# Patient Record
Sex: Male | Born: 1960 | Race: White | Hispanic: No
Health system: Southern US, Community
[De-identification: ages and names within clinical notes are randomized; demographics above are authoritative.]

## PROBLEM LIST (undated history)

## (undated) DIAGNOSIS — F1010 Alcohol abuse, uncomplicated: Secondary | ICD-10-CM

---

## 2016-08-03 ENCOUNTER — Emergency Department (HOSPITAL_COMMUNITY): Payer: No Typology Code available for payment source

## 2016-08-03 ENCOUNTER — Inpatient Hospital Stay (HOSPITAL_COMMUNITY)
Admission: EM | Admit: 2016-08-03 | Discharge: 2016-09-06 | DRG: 296 | Disposition: E | Payer: No Typology Code available for payment source | Attending: Surgery | Admitting: Surgery

## 2016-08-03 ENCOUNTER — Encounter (HOSPITAL_COMMUNITY): Payer: Self-pay

## 2016-08-03 DIAGNOSIS — G931 Anoxic brain damage, not elsewhere classified: Secondary | ICD-10-CM | POA: Diagnosis present

## 2016-08-03 DIAGNOSIS — Y908 Blood alcohol level of 240 mg/100 ml or more: Secondary | ICD-10-CM | POA: Diagnosis present

## 2016-08-03 DIAGNOSIS — R000 Tachycardia, unspecified: Secondary | ICD-10-CM | POA: Diagnosis not present

## 2016-08-03 DIAGNOSIS — Z515 Encounter for palliative care: Secondary | ICD-10-CM | POA: Diagnosis present

## 2016-08-03 DIAGNOSIS — S0003XA Contusion of scalp, initial encounter: Secondary | ICD-10-CM | POA: Diagnosis present

## 2016-08-03 DIAGNOSIS — S2249XA Multiple fractures of ribs, unspecified side, initial encounter for closed fracture: Secondary | ICD-10-CM | POA: Diagnosis present

## 2016-08-03 DIAGNOSIS — E876 Hypokalemia: Secondary | ICD-10-CM | POA: Diagnosis present

## 2016-08-03 DIAGNOSIS — Y92488 Other paved roadways as the place of occurrence of the external cause: Secondary | ICD-10-CM | POA: Diagnosis not present

## 2016-08-03 DIAGNOSIS — F10129 Alcohol abuse with intoxication, unspecified: Secondary | ICD-10-CM | POA: Diagnosis present

## 2016-08-03 DIAGNOSIS — I469 Cardiac arrest, cause unspecified: Principal | ICD-10-CM | POA: Diagnosis present

## 2016-08-03 DIAGNOSIS — R402112 Coma scale, eyes open, never, at arrival to emergency department: Secondary | ICD-10-CM | POA: Diagnosis present

## 2016-08-03 DIAGNOSIS — R402312 Coma scale, best motor response, none, at arrival to emergency department: Secondary | ICD-10-CM | POA: Diagnosis present

## 2016-08-03 DIAGNOSIS — R0902 Hypoxemia: Secondary | ICD-10-CM | POA: Diagnosis present

## 2016-08-03 DIAGNOSIS — Z66 Do not resuscitate: Secondary | ICD-10-CM | POA: Diagnosis present

## 2016-08-03 DIAGNOSIS — I468 Cardiac arrest due to other underlying condition: Secondary | ICD-10-CM | POA: Diagnosis present

## 2016-08-03 DIAGNOSIS — Z978 Presence of other specified devices: Secondary | ICD-10-CM

## 2016-08-03 DIAGNOSIS — R402212 Coma scale, best verbal response, none, at arrival to emergency department: Secondary | ICD-10-CM | POA: Diagnosis present

## 2016-08-03 DIAGNOSIS — S0990XA Unspecified injury of head, initial encounter: Secondary | ICD-10-CM | POA: Diagnosis present

## 2016-08-03 DIAGNOSIS — Z7189 Other specified counseling: Secondary | ICD-10-CM

## 2016-08-03 DIAGNOSIS — D62 Acute posthemorrhagic anemia: Secondary | ICD-10-CM | POA: Diagnosis present

## 2016-08-03 HISTORY — DX: Alcohol abuse, uncomplicated: F10.10

## 2016-08-03 LAB — I-STAT ARTERIAL BLOOD GAS, ED
ACID-BASE DEFICIT: 12 mmol/L — AB (ref 0.0–2.0)
Bicarbonate: 13 mmol/L — ABNORMAL LOW (ref 20.0–28.0)
O2 SAT: 100 %
PCO2 ART: 25.3 mmHg — AB (ref 32.0–48.0)
PH ART: 7.312 — AB (ref 7.350–7.450)
TCO2: 14 mmol/L (ref 0–100)
pO2, Arterial: 569 mmHg — ABNORMAL HIGH (ref 83.0–108.0)

## 2016-08-03 LAB — CBC WITH DIFFERENTIAL/PLATELET
Basophils Absolute: 0.1 10*3/uL (ref 0.0–0.1)
Basophils Relative: 1 %
EOS ABS: 0 10*3/uL (ref 0.0–0.7)
EOS PCT: 1 %
HCT: 35.3 % — ABNORMAL LOW (ref 39.0–52.0)
HEMOGLOBIN: 11.7 g/dL — AB (ref 13.0–17.0)
LYMPHS ABS: 3 10*3/uL (ref 0.7–4.0)
Lymphocytes Relative: 61 %
MCH: 28.5 pg (ref 26.0–34.0)
MCHC: 33.1 g/dL (ref 30.0–36.0)
MCV: 85.9 fL (ref 78.0–100.0)
MONOS PCT: 4 %
Monocytes Absolute: 0.2 10*3/uL (ref 0.1–1.0)
Neutro Abs: 1.6 10*3/uL — ABNORMAL LOW (ref 1.7–7.7)
Neutrophils Relative %: 33 %
PLATELETS: 101 10*3/uL — AB (ref 150–400)
RBC: 4.11 MIL/uL — ABNORMAL LOW (ref 4.22–5.81)
RDW: 14.4 % (ref 11.5–15.5)
WBC: 4.9 10*3/uL (ref 4.0–10.5)

## 2016-08-03 LAB — PROTIME-INR
INR: 1.28
PROTHROMBIN TIME: 16.1 s — AB (ref 11.4–15.2)

## 2016-08-03 LAB — I-STAT CHEM 8, ED
BUN: <3 mg/dL — ABNORMAL LOW (ref 6–20)
CREATININE: 1.1 mg/dL (ref 0.61–1.24)
Calcium, Ion: 0.81 mmol/L — CL (ref 1.15–1.40)
Chloride: 99 mmol/L — ABNORMAL LOW (ref 101–111)
GLUCOSE: 118 mg/dL — AB (ref 65–99)
HCT: 34 % — ABNORMAL LOW (ref 39.0–52.0)
HEMOGLOBIN: 11.6 g/dL — AB (ref 13.0–17.0)
Potassium: 3.6 mmol/L (ref 3.5–5.1)
Sodium: 132 mmol/L — ABNORMAL LOW (ref 135–145)
TCO2: 13 mmol/L (ref 0–100)

## 2016-08-03 LAB — COMPREHENSIVE METABOLIC PANEL
ALT: 798 U/L — AB (ref 17–63)
AST: 1641 U/L — AB (ref 15–41)
Albumin: 3.3 g/dL — ABNORMAL LOW (ref 3.5–5.0)
Alkaline Phosphatase: 137 U/L — ABNORMAL HIGH (ref 38–126)
Anion gap: 20 — ABNORMAL HIGH (ref 5–15)
BUN: 5 mg/dL — AB (ref 6–20)
CHLORIDE: 94 mmol/L — AB (ref 101–111)
CO2: 16 mmol/L — AB (ref 22–32)
CREATININE: 1.05 mg/dL (ref 0.61–1.24)
Calcium: 7.9 mg/dL — ABNORMAL LOW (ref 8.9–10.3)
Glucose, Bld: 200 mg/dL — ABNORMAL HIGH (ref 65–99)
POTASSIUM: 3.8 mmol/L (ref 3.5–5.1)
SODIUM: 130 mmol/L — AB (ref 135–145)
Total Bilirubin: 1 mg/dL (ref 0.3–1.2)
Total Protein: 6.8 g/dL (ref 6.5–8.1)

## 2016-08-03 LAB — URINALYSIS, ROUTINE W REFLEX MICROSCOPIC
Bilirubin Urine: NEGATIVE
Glucose, UA: 50 mg/dL — AB
Ketones, ur: NEGATIVE mg/dL
Leukocytes, UA: NEGATIVE
Nitrite: NEGATIVE
Protein, ur: 100 mg/dL — AB
SPECIFIC GRAVITY, URINE: 1.01 (ref 1.005–1.030)
pH: 6 (ref 5.0–8.0)

## 2016-08-03 LAB — I-STAT CG4 LACTIC ACID, ED: LACTIC ACID, VENOUS: 9.78 mmol/L — AB (ref 0.5–1.9)

## 2016-08-03 LAB — ETHANOL: Alcohol, Ethyl (B): 380 mg/dL (ref <5)

## 2016-08-03 MED ORDER — ENOXAPARIN SODIUM 30 MG/0.3ML ~~LOC~~ SOLN
30.0000 mg | Freq: Two times a day (BID) | SUBCUTANEOUS | Status: DC
  Administered 2016-08-04 – 2016-08-06 (×6): 30 mg via SUBCUTANEOUS
  Filled 2016-08-03 (×7): qty 0.3

## 2016-08-03 MED ORDER — SODIUM CHLORIDE 0.9 % IV SOLN
INTRAVENOUS | Status: DC
  Administered 2016-08-04 – 2016-08-07 (×8): via INTRAVENOUS

## 2016-08-03 MED ORDER — ORAL CARE MOUTH RINSE: 15.0000 mL | OROMUCOSAL | Status: DC

## 2016-08-03 MED ORDER — PANTOPRAZOLE SODIUM 40 MG IV SOLR
40.0000 mg | Freq: Every day | INTRAVENOUS | Status: DC
  Administered 2016-08-04 – 2016-08-07 (×4): 40 mg via INTRAVENOUS
  Filled 2016-08-03 (×3): qty 40

## 2016-08-03 MED ORDER — CHLORHEXIDINE GLUCONATE 0.12% ORAL RINSE (MEDLINE KIT)
15.0000 mL | Freq: Two times a day (BID) | OROMUCOSAL | Status: DC
  Administered 2016-08-04 – 2016-08-07 (×8): 15 mL via OROMUCOSAL

## 2016-08-03 MED ORDER — PANTOPRAZOLE SODIUM 40 MG PO TBEC: 40.0000 mg | DELAYED_RELEASE_TABLET | Freq: Every day | ORAL | Status: DC

## 2016-08-03 MED ORDER — IOPAMIDOL (ISOVUE-300) INJECTION 61%
INTRAVENOUS | Status: AC
  Filled 2016-08-03: qty 100

## 2016-08-03 NOTE — ED Notes (Signed)
Dr. Corliss Skainssuei informed of pts ethanol 380

## 2016-08-03 NOTE — ED Notes (Signed)
Patient reconnected to monitor.

## 2016-08-03 NOTE — ED Notes (Signed)
Pt has small abrasions noted to bridge of nose, left eyebrow with hematoma, left portion of upper lip and small abrasion on forehead and one abrasion to left knee. No active bleeding.
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
## 2016-08-03 NOTE — ED Notes (Signed)
X-ray at bedside

## 2016-08-03 NOTE — ED Notes (Signed)
Report has been called to 4N. This RN awaiting for respiratory tech to transport pt upstairs
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
## 2016-08-03 NOTE — Progress Notes (Signed)
Orthopedic Tech Progress Note Patient Details:  Neldon LabellaJesse Hines 08/30/1960 [id number] Level 1 trauma ortho visit. Patient ID: Neldon LabellaJesse Musick, male   DOB: 08/02/1960, 56 y.o.   MRN: [id number]   Jennye MoccasinHughes, Reason Helzer Craig Dec 14, 2016, 9:52 PM
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
## 2016-08-03 NOTE — ED Triage Notes (Signed)
[facility]
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
## 2016-08-03 NOTE — ED Notes (Signed)
Pt back from ct
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
## 2016-08-04 ENCOUNTER — Inpatient Hospital Stay (HOSPITAL_COMMUNITY): Payer: No Typology Code available for payment source

## 2016-08-04 DIAGNOSIS — I469 Cardiac arrest, cause unspecified: Principal | ICD-10-CM

## 2016-08-04 LAB — TYPE AND SCREEN
ABO/RH(D): O NEG
Antibody Screen: NEGATIVE
UNIT DIVISION: 0
UNIT DIVISION: 0

## 2016-08-04 LAB — BPAM FFP
BLOOD PRODUCT EXPIRATION DATE: 201808052359
Blood Product Expiration Date: 201808052359
ISSUE DATE / TIME: 201807292119
ISSUE DATE / TIME: 201807292119
UNIT TYPE AND RH: 600
UNIT TYPE AND RH: 6200

## 2016-08-04 LAB — CBC WITH DIFFERENTIAL/PLATELET
BASOS PCT: 0 %
Basophils Absolute: 0 10*3/uL (ref 0.0–0.1)
EOS ABS: 0 10*3/uL (ref 0.0–0.7)
Eosinophils Relative: 0 %
HCT: 37.1 % — ABNORMAL LOW (ref 39.0–52.0)
Hemoglobin: 12.5 g/dL — ABNORMAL LOW (ref 13.0–17.0)
Lymphocytes Relative: 5 %
Lymphs Abs: 0.7 10*3/uL (ref 0.7–4.0)
MCH: 28.2 pg (ref 26.0–34.0)
MCHC: 33.7 g/dL (ref 30.0–36.0)
MCV: 83.6 fL (ref 78.0–100.0)
MONO ABS: 1.3 10*3/uL — AB (ref 0.1–1.0)
Monocytes Relative: 9 %
NEUTROS PCT: 86 %
Neutro Abs: 12.6 10*3/uL — ABNORMAL HIGH (ref 1.7–7.7)
PLATELETS: 98 10*3/uL — AB (ref 150–400)
RBC: 4.44 MIL/uL (ref 4.22–5.81)
RDW: 14.6 % (ref 11.5–15.5)
WBC: 14.6 10*3/uL — ABNORMAL HIGH (ref 4.0–10.5)

## 2016-08-04 LAB — BASIC METABOLIC PANEL
Anion gap: 17 — ABNORMAL HIGH (ref 5–15)
CALCIUM: 7.8 mg/dL — AB (ref 8.9–10.3)
CO2: 15 mmol/L — ABNORMAL LOW (ref 22–32)
CREATININE: 0.86 mg/dL (ref 0.61–1.24)
Chloride: 101 mmol/L (ref 101–111)
GFR calc Af Amer: >60 mL/min (ref >60)
Glucose, Bld: 113 mg/dL — ABNORMAL HIGH (ref 65–99)
POTASSIUM: 3.4 mmol/L — AB (ref 3.5–5.1)
SODIUM: 133 mmol/L — AB (ref 135–145)

## 2016-08-04 LAB — POCT I-STAT 3, ART BLOOD GAS (G3+)
ACID-BASE DEFICIT: 8 mmol/L — AB (ref 0.0–2.0)
Bicarbonate: 14.8 mmol/L — ABNORMAL LOW (ref 20.0–28.0)
O2 SAT: 100 %
PH ART: 7.43 (ref 7.350–7.450)
TCO2: 15 mmol/L (ref 0–100)
pCO2 arterial: 22.3 mmHg — ABNORMAL LOW (ref 32.0–48.0)
pO2, Arterial: 161 mmHg — ABNORMAL HIGH (ref 83.0–108.0)

## 2016-08-04 LAB — CDS SEROLOGY

## 2016-08-04 LAB — BPAM RBC
BLOOD PRODUCT EXPIRATION DATE: 201808012359
Blood Product Expiration Date: 201808012359
ISSUE DATE / TIME: 201807292118
ISSUE DATE / TIME: 201807300437
Unit Type and Rh: 9500
Unit Type and Rh: 9500

## 2016-08-04 LAB — PREPARE FRESH FROZEN PLASMA
UNIT DIVISION: 0
Unit division: 0

## 2016-08-04 LAB — HIV ANTIBODY (ROUTINE TESTING W REFLEX): HIV Screen 4th Generation wRfx: NONREACTIVE

## 2016-08-04 LAB — BLOOD PRODUCT ORDER (VERBAL) VERIFICATION

## 2016-08-04 LAB — MRSA PCR SCREENING: MRSA by PCR: NEGATIVE

## 2016-08-04 LAB — ABO/RH: ABO/RH(D): O NEG

## 2016-08-04 LAB — LACTIC ACID, PLASMA: Lactic Acid, Venous: 2.8 mmol/L (ref 0.5–1.9)

## 2016-08-04 LAB — TRIGLYCERIDES: TRIGLYCERIDES: 110 mg/dL (ref <150)

## 2016-08-04 MED ORDER — METOPROLOL TARTRATE 5 MG/5ML IV SOLN
5.0000 mg | Freq: Four times a day (QID) | INTRAVENOUS | Status: DC | PRN
  Administered 2016-08-04 (×3): 5 mg via INTRAVENOUS
  Filled 2016-08-04 (×2): qty 5
  Filled 2016-08-04: qty 10

## 2016-08-04 MED ORDER — FENTANYL CITRATE (PF) 100 MCG/2ML IJ SOLN
50.0000 ug | Freq: Once | INTRAMUSCULAR | Status: AC
  Administered 2016-08-04: 50 ug via INTRAVENOUS

## 2016-08-04 MED ORDER — FENTANYL BOLUS VIA INFUSION
50.0000 ug | INTRAVENOUS | Status: DC | PRN
  Filled 2016-08-04: qty 50

## 2016-08-04 MED ORDER — PROPOFOL 1000 MG/100ML IV EMUL
5.0000 ug/kg/min | INTRAVENOUS | Status: DC
  Administered 2016-08-04: 10 ug/kg/min via INTRAVENOUS
  Filled 2016-08-04: qty 100

## 2016-08-04 MED ORDER — ORAL CARE MOUTH RINSE
15.0000 mL | OROMUCOSAL | Status: DC
  Administered 2016-08-04 – 2016-08-07 (×35): 15 mL via OROMUCOSAL

## 2016-08-04 MED ORDER — SODIUM CHLORIDE 0.9 % IV SOLN
25.0000 mg | Freq: Four times a day (QID) | INTRAVENOUS | Status: DC | PRN
  Administered 2016-08-04: 25 mg via INTRAVENOUS
  Filled 2016-08-04: qty 1

## 2016-08-04 MED ORDER — CHLORHEXIDINE GLUCONATE 0.12% ORAL RINSE (MEDLINE KIT): 15.0000 mL | Freq: Two times a day (BID) | OROMUCOSAL | Status: DC

## 2016-08-04 MED ORDER — SODIUM BICARBONATE 8.4 % IV SOLN: 100.0000 meq | Freq: Once | INTRAVENOUS | Status: DC

## 2016-08-04 MED ORDER — HYDRALAZINE HCL 20 MG/ML IJ SOLN
10.0000 mg | INTRAMUSCULAR | Status: DC | PRN
  Administered 2016-08-04 – 2016-08-07 (×6): 10 mg via INTRAVENOUS
  Filled 2016-08-04 (×6): qty 1

## 2016-08-04 MED ORDER — METOPROLOL TARTRATE 5 MG/5ML IV SOLN
5.0000 mg | INTRAVENOUS | Status: DC | PRN
  Administered 2016-08-04: 5 mg via INTRAVENOUS

## 2016-08-04 MED ORDER — ACETAMINOPHEN 325 MG PO TABS: 650.0000 mg | ORAL_TABLET | ORAL | Status: DC | PRN

## 2016-08-04 MED ORDER — ACETAMINOPHEN 160 MG/5ML PO SOLN
650.0000 mg | ORAL | Status: DC | PRN
  Administered 2016-08-04 – 2016-08-06 (×3): 650 mg
  Filled 2016-08-04 (×3): qty 20.3

## 2016-08-04 MED ORDER — FENTANYL 2500MCG IN NS 250ML (10MCG/ML) PREMIX INFUSION
25.0000 ug/h | INTRAVENOUS | Status: DC
  Administered 2016-08-04: 50 ug/h via INTRAVENOUS
  Administered 2016-08-05 (×2): 200 ug/h via INTRAVENOUS
  Filled 2016-08-04 (×2): qty 250

## 2016-08-04 MED ORDER — FENTANYL CITRATE (PF) 100 MCG/2ML IJ SOLN
50.0000 ug | INTRAMUSCULAR | Status: DC | PRN
  Administered 2016-08-04 (×5): 50 ug via INTRAVENOUS
  Filled 2016-08-04 (×5): qty 2

## 2016-08-04 MED ORDER — SODIUM CHLORIDE 0.9 % IV SOLN
1000.0000 mg | Freq: Two times a day (BID) | INTRAVENOUS | Status: DC
  Administered 2016-08-04 – 2016-08-07 (×7): 1000 mg via INTRAVENOUS
  Filled 2016-08-04 (×8): qty 10

## 2016-08-04 NOTE — Progress Notes (Signed)
Patient transported to MRI and returned to 4N20. No complications. Vital signs stable at this time. Patient tolerated well. RN at bedside. RT will continue to monitor.

## 2016-08-04 NOTE — Progress Notes (Signed)
Pt was transported to CT and back to [facility] room A with no complications.  

## 2016-08-04 NOTE — Progress Notes (Signed)
Follow up - Trauma and Critical Care  Patient Details:    Perry Perry Hull is an 56 y.o. male.  Lines/tubes : Airway 7 mm (Active)  Secured at (cm) 25 cm 08/04/2016  7:53 AM  Measured From Lips 08/04/2016  7:53 AM  Secured Location Center 08/04/2016  7:53 AM  Secured By [employer] Tube Holder 08/04/2016  7:53 AM  Tube Holder Repositioned Yes 08/04/2016  7:53 AM  Site Condition Dry 08/04/2016  7:53 AM     NG/OG Tube Orogastric 16 Fr. Center mouth Aucultation (Active)  Site Assessment Clean;Dry;Intact February 16, 2016 11:45 PM  Ongoing Placement Verification No change in respiratory status;No acute changes, not attributed to clinical condition February 16, 2016 11:45 PM  Status Suction-low intermittent February 16, 2016 11:45 PM  Amount of suction 97 mmHg February 16, 2016 11:45 PM  Drainage Appearance Brown;Green February 16, 2016 11:45 PM  Output (mL) 200 mL 08/04/2016  5:38 AM     Urethral Catheter Prom, EMT Non-latex 16 Fr. (Active)  Indication for Insertion or Continuance of Catheter Unstable critical patients (first 24-48 hours) February 16, 2016 11:45 PM  Site Assessment Clean;Intact February 16, 2016 11:45 PM  Catheter Maintenance Bag below level of bladder;Catheter secured;Drainage bag/tubing not touching floor;Insertion date on drainage bag;No dependent loops;Seal intact February 16, 2016 11:45 PM  Collection Container Standard drainage bag February 16, 2016 11:45 PM  Securement Method Securing device (Describe) February 16, 2016 11:45 PM  Output (mL) 380 mL 08/04/2016  5:38 AM    Microbiology/Sepsis markers: Results for orders placed or performed during the hospital encounter of 2016/05/29  MRSA PCR Screening     Status: None   Collection Time: 08/04/16 12:57 AM  Result Value Ref Range Status   MRSA by PCR NEGATIVE NEGATIVE Final    Comment:        The GeneXpert MRSA Assay (FDA approved for NASAL specimens only), is one component of a comprehensive MRSA colonization surveillance program. It is not intended to diagnose MRSA infection nor to guide  or monitor treatment for MRSA infections.     Anti-infectives:  Anti-infectives    None      Best Practice/Protocols:  VTE Prophylaxis: Lovenox (prophylaxtic dose) and Mechanical GI Prophylaxis: Proton Pump Inhibitor Sedation is currently off  Consults:  Request for Neurology consultation this AM   Events:  Subjective:    Overnight Issues: Has awakened a bit more, starting to wean on the ventilator  Objective:  Vital signs for last 24 hours: Temp:  [95.9 F (35.5 C)-102 F (38.9 C)] 101.3 F (38.5 C) (07/30 0630) Pulse Rate:  [68-106] 77 (07/30 0753) Resp:  [12-35] 32 (07/30 0753) BP: (84-152)/(51-97) 128/70 (07/30 0753) SpO2:  [92 %-100 %] 100 % (07/30 0630) FiO2 (%):  [40 %-100 %] 40 % (07/30 0753) Weight:  [72.9 kg (160 lb 11.5 oz)-99.8 kg (220 lb)] 72.9 kg (160 lb 11.5 oz) (07/30 0000)  Hemodynamic parameters for last 24 hours:    Intake/Output from previous day: 07/29 0701 - 07/30 0700 In: 4606.3 [I.V.:4606.3] Out: 2910 [Urine:2380; Emesis/NG output:200; Stool:330]  Intake/Output this shift: No intake/output data recorded.  Vent settings for last 24 hours: Vent Mode: CPAP;PSV FiO2 (%):  [40 %-100 %] 40 % Set Rate:  [12 bmp-20 bmp] 12 bmp Vt Set:  [500 mL-600 mL] 520 mL PEEP:  [5 cmH20] 5 cmH20 Pressure Support:  [8 cmH20] 8 cmH20 Plateau Pressure:  [14 cmH20-18 cmH20] 14 cmH20  Physical Exam:  General: no respiratory distress and hiccuping constantly not following any commands Neuro: RASS -3 or deeper, weakness right lower extremity, weakness left lower extremity and possible  seizures HEENT/Neck: no JVD, ETT WNL , PERRL and pupils 2-56mm and reactive, abrasions to the face Resp: clear to auscultation bilaterally and CXR without focal infiltrates CVS: regular rate and rhythm, S1, S2 normal, no murmur, click, rub or gallop GI: soft with good bowel sounds.  Had profuse diarrhea earlier and no sphincter tone. Extremities: no edema, no erythema,  pulses WNL  Results for orders placed or performed during the hospital encounter of 07/26/2016 (from the past 24 hour(s))  CBC with Differential/Platelet     Status: Abnormal   Collection Time: 07/29/2016  8:45 PM  Result Value Ref Range   WBC 4.9 4.0 - 10.5 K/uL   RBC 4.11 (L) 4.22 - 5.81 MIL/uL   Hemoglobin 11.7 (L) 13.0 - 17.0 g/dL   HCT 16.1 (L) 09.6 - 04.5 %   MCV 85.9 78.0 - 100.0 fL   MCH 28.5 26.0 - 34.0 pg   MCHC 33.1 30.0 - 36.0 g/dL   RDW 40.9 81.1 - 91.4 %   Platelets 101 (L) 150 - 400 K/uL   Neutrophils Relative % 33 %   Neutro Abs 1.6 (L) 1.7 - 7.7 K/uL   Lymphocytes Relative 61 %   Lymphs Abs 3.0 0.7 - 4.0 K/uL   Monocytes Relative 4 %   Monocytes Absolute 0.2 0.1 - 1.0 K/uL   Eosinophils Relative 1 %   Eosinophils Absolute 0.0 0.0 - 0.7 K/uL   Basophils Relative 1 %   Basophils Absolute 0.1 0.0 - 0.1 K/uL  Comprehensive metabolic panel     Status: Abnormal   Collection Time: 07/08/2016  8:45 PM  Result Value Ref Range   Sodium 130 (L) 135 - 145 mmol/L   Potassium 3.8 3.5 - 5.1 mmol/L   Chloride 94 (L) 101 - 111 mmol/L   CO2 16 (L) 22 - 32 mmol/L   Glucose, Bld 200 (H) 65 - 99 mg/dL   BUN 5 (L) 6 - 20 mg/dL   Creatinine, Ser 7.82 0.61 - 1.24 mg/dL   Calcium 7.9 (L) 8.9 - 10.3 mg/dL   Total Protein 6.8 6.5 - 8.1 g/dL   Albumin 3.3 (L) 3.5 - 5.0 g/dL   AST 9,562 (H) 15 - 41 U/L   ALT 798 (H) 17 - 63 U/L   Alkaline Phosphatase 137 (H) 38 - 126 U/L   Total Bilirubin 1.0 0.3 - 1.2 mg/dL   GFR calc non Af Amer >60 >60 mL/min   GFR calc Af Amer >60 >60 mL/min   Anion gap 20 (H) 5 - 15  Type and screen     Status: None   Collection Time: 07/18/2016  9:40 PM  Result Value Ref Range   ABO/RH(D) O NEG    Antibody Screen NEG    Sample Expiration 08/06/2016    Unit Number [id number]    Blood Component Type RED CELLS,LR    Unit division 00    Status of Unit REL FROM [facility]    Unit tag comment VERBAL ORDERS PER DR mesner    Transfusion Status OK TO TRANSFUSE     Crossmatch Result NOT NEEDED    Unit Number [id number]    Blood Component Type RBC LR PHER2    Unit division 00    Status of Unit REL FROM [facility]    Unit tag comment VERBAL ORDERS PER DR MESNER    Transfusion Status OK TO TRANSFUSE    Crossmatch Result NOT NEEDED   Prepare fresh frozen plasma     Status: None  Collection Time: 05-05-2016  9:40 PM  Result Value Ref Range   Unit Number [id number]    Blood Component Type LIQ PLASMA    Unit division 00    [facility]    Unit tag comment VERBAL ORDERS PER DR MESNER    Transfusion Status OK TO TRANSFUSE    Unit Number [id number]    Blood Component Type LIQ PLASMA    Unit division 00    Status [facility]    Unit tag comment VERBAL ORDERS PER DR MESNER    Transfusion Status OK TO TRANSFUSE   CDS serology     Status: None   Collection Time: 05-05-2016  9:40 PM  Result Value Ref Range   CDS serology specimen      SPECIMEN WILL BE HELD FOR 14 DAYS IF TESTING IS REQUIRED  Ethanol     Status: Abnormal   Collection Time: 05-05-2016  9:40 PM  Result Value Ref Range   Alcohol, Ethyl (B) 380 (HH) <5 mg/dL  Protime-INR     Status: Abnormal   Collection Time: 05-05-2016  9:40 PM  Result Value Ref Range   Prothrombin Time 16.1 (H) 11.4 - 15.2 seconds   INR 1.28   ABO/Rh     Status: None (Preliminary result)   Collection Time: 05-05-2016  9:40 PM  Result Value Ref Range   ABO/RH(D) O NEG   I-Stat Chem 8, ED     Status: Abnormal   Collection Time: 05-05-2016  9:59 PM  Result Value Ref Range   Sodium 132 (L) 135 - 145 mmol/L   Potassium 3.6 3.5 - 5.1 mmol/L   Chloride 99 (L) 101 - 111 mmol/L   BUN <3 (L) 6 - 20 mg/dL   Creatinine, Ser 3.081.10 0.61 - 1.24 mg/dL   Glucose, Bld 657118 (H) 65 - 99 mg/dL   Calcium, Ion 8.460.81 (LL) 1.15 - 1.40 mmol/L   TCO2 13 0 - 100 mmol/L   Hemoglobin 11.6 (L) 13.0 - 17.0 g/dL   HCT 96.234.0 (L) 95.239.0 - 84.152.0 %  I-Stat CG4 Lactic Acid, ED     Status: Abnormal   Collection Time: 05-05-2016   9:59 PM  Result Value Ref Range   Lactic Acid, Venous 9.78 (HH) 0.5 - 1.9 mmol/L   Comment NOTIFIED PHYSICIAN   I-Stat arterial blood gas, ED     Status: Abnormal   Collection Time: 05-05-2016 10:29 PM  Result Value Ref Range   pH, Arterial 7.312 (L) 7.350 - 7.450   pCO2 arterial 25.3 (L) 32.0 - 48.0 mmHg   pO2, Arterial 569.0 (H) 83.0 - 108.0 mmHg   Bicarbonate 13.0 (L) 20.0 - 28.0 mmol/L   TCO2 14 0 - 100 mmol/L   O2 Saturation 100.0 %   Acid-base deficit 12.0 (H) 0.0 - 2.0 mmol/L   Patient temperature 96.9 F    Collection site RADIAL, ALLEN'S TEST ACCEPTABLE    Drawn by RT    Sample type ARTERIAL   Urinalysis, Routine w reflex microscopic     Status: Abnormal   Collection Time: 05-05-2016 10:30 PM  Result Value Ref Range   Color, Urine YELLOW YELLOW   APPearance HAZY (A) CLEAR   Specific Gravity, Urine 1.010 1.005 - 1.030   pH 6.0 5.0 - 8.0   Glucose, UA 50 (A) NEGATIVE mg/dL   Hgb urine dipstick MODERATE (A) NEGATIVE   Bilirubin Urine NEGATIVE NEGATIVE   Ketones, ur NEGATIVE NEGATIVE mg/dL   Protein,  ur 100 (A) NEGATIVE mg/dL   Nitrite NEGATIVE NEGATIVE   Leukocytes, UA NEGATIVE NEGATIVE   RBC / HPF 0-5 0 - 5 RBC/hpf   WBC, UA 0-5 0 - 5 WBC/hpf   Bacteria, UA RARE (A) NONE SEEN   Squamous Epithelial / LPF 0-5 (A) NONE SEEN   Mucous PRESENT   MRSA PCR Screening     Status: None   Collection Time: 08/04/16 12:57 AM  Result Value Ref Range   MRSA by PCR NEGATIVE NEGATIVE  Triglycerides     Status: None   Collection Time: 08/04/16  2:05 AM  Result Value Ref Range   Triglycerides 110 <150 mg/dL  Provider-confirm verbal Blood Bank order - RBC, FFP, Type & Screen; 2 Units; Order taken: 07/25/2016; 9:16 PM; Level 1 Trauma, Emergency Release, STAT 2 units of O negative red cells and 2 units of A plasmas emergency released to the ER @ 2125. All...     Status: None   Collection Time: 08/04/16  7:30 AM  Result Value Ref Range   Blood product order confirm MD AUTHORIZATION REQUESTED       Assessment/Plan:   NEURO  Altered Mental Status:  coma   Plan: Will minimize sedation until neurology can make and assessment  PULM  No known issues    Plan: CPM  CARDIO  No specific issues   Plan: CPM  RENAL  Urine output has been good.   Plan: CPM  GI  No current issues   Plan: CPM, will consider tube feedings.  ID  No known infectious sopurces   Plan: CPM  HEME  Anemia acute blood loss anemia)   Plan: Only mild anemia, no transfusion necessary  ENDO No known issues   Plan: CPM  Global Issues  Patient admitted with much deeper  Level of consciousness than currently.  He is not clinically brain dead although e could still have some significant brain ischemia/anoxia from the original injury and the arrest leading to CPR.  Neurology consultation has been requested.    LOS: 1 day   Additional comments:I have discussed and reviewed with family members patient's mostly sisters and informed them that he is currently not brain dead  Critical Care Total Time*: 45 Minutes  Pati Thinnes 08/04/2016  *Care during the described time interval was provided by me and/or other providers on the critical care team.  I have reviewed this patient's available data, including medical history, events of note, physical examination and test results as part of my evaluation.
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
## 2016-08-04 NOTE — Progress Notes (Signed)
[facility] to [facility] with no complications.

## 2016-08-04 NOTE — Progress Notes (Signed)
STAT EEG completed. Results pending. 
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
## 2016-08-05 ENCOUNTER — Inpatient Hospital Stay (HOSPITAL_COMMUNITY): Payer: No Typology Code available for payment source

## 2016-08-05 LAB — BLOOD GAS, ARTERIAL
Acid-base deficit: 2.2 mmol/L — ABNORMAL HIGH (ref 0.0–2.0)
Bicarbonate: 21.1 mmol/L (ref 20.0–28.0)
DRAWN BY: 44135
FIO2: 0.4
MECHVT: 480 mL
O2 SAT: 99.4 %
PEEP/CPAP: 5 cmH2O
PO2 ART: 187 mmHg — AB (ref 83.0–108.0)
Patient temperature: 99.9
RATE: 12 resp/min
pCO2 arterial: 30.8 mmHg — ABNORMAL LOW (ref 32.0–48.0)
pH, Arterial: 7.453 — ABNORMAL HIGH (ref 7.350–7.450)

## 2016-08-05 LAB — CBC WITH DIFFERENTIAL/PLATELET
Basophils Absolute: 0 10*3/uL (ref 0.0–0.1)
Basophils Relative: 0 %
EOS ABS: 0 10*3/uL (ref 0.0–0.7)
EOS PCT: 0 %
HCT: 31.6 % — ABNORMAL LOW (ref 39.0–52.0)
Hemoglobin: 10.4 g/dL — ABNORMAL LOW (ref 13.0–17.0)
LYMPHS ABS: 0.8 10*3/uL (ref 0.7–4.0)
LYMPHS PCT: 6 %
MCH: 27.2 pg (ref 26.0–34.0)
MCHC: 32.9 g/dL (ref 30.0–36.0)
MCV: 82.7 fL (ref 78.0–100.0)
MONO ABS: 1.1 10*3/uL — AB (ref 0.1–1.0)
MONOS PCT: 9 %
Neutro Abs: 10.4 10*3/uL — ABNORMAL HIGH (ref 1.7–7.7)
Neutrophils Relative %: 84 %
PLATELETS: 78 10*3/uL — AB (ref 150–400)
RBC: 3.82 MIL/uL — AB (ref 4.22–5.81)
RDW: 14.9 % (ref 11.5–15.5)
WBC: 12.3 10*3/uL — AB (ref 4.0–10.5)

## 2016-08-05 LAB — BASIC METABOLIC PANEL
Anion gap: 9 (ref 5–15)
BUN: 8 mg/dL (ref 6–20)
CALCIUM: 7.9 mg/dL — AB (ref 8.9–10.3)
CO2: 21 mmol/L — ABNORMAL LOW (ref 22–32)
CREATININE: 0.89 mg/dL (ref 0.61–1.24)
Chloride: 104 mmol/L (ref 101–111)
GFR calc Af Amer: >60 mL/min (ref >60)
GLUCOSE: 115 mg/dL — AB (ref 65–99)
Potassium: 3.6 mmol/L (ref 3.5–5.1)
SODIUM: 134 mmol/L — AB (ref 135–145)
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
## 2016-08-05 NOTE — Plan of Care (Signed)
Problem: Physical Regulation: Goal: Neurologic status will improve Outcome: Not Progressing Pt has no gag, cough, or corneal reflexes. Pt does not move to painful stimuli, unresponsive.  Problem: Respiratory: Goal: Ability to maintain adequate ventilation will improve Outcome: Progressing Pt remains on ventilator, able to wean throughout day.
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
## 2016-08-06 LAB — RAPID URINE DRUG SCREEN, HOSP PERFORMED
Amphetamines: NOT DETECTED
BARBITURATES: NOT DETECTED
BENZODIAZEPINES: NOT DETECTED
Cocaine: NOT DETECTED
Opiates: NOT DETECTED
Tetrahydrocannabinol: NOT DETECTED
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
## 2016-08-06 NOTE — Plan of Care (Signed)
Problem: Communication: Goal: Ability to communicate needs accurately will improve Outcome: Not Progressing Pt unresponsive, GCS 3

## 2016-08-06 DEATH — deceased

## 2016-08-07 DIAGNOSIS — G931 Anoxic brain damage, not elsewhere classified: Secondary | ICD-10-CM

## 2016-08-07 DIAGNOSIS — Z7189 Other specified counseling: Secondary | ICD-10-CM

## 2016-08-07 DIAGNOSIS — Z515 Encounter for palliative care: Secondary | ICD-10-CM

## 2016-08-07 LAB — BASIC METABOLIC PANEL
Anion gap: 10 (ref 5–15)
BUN: 7 mg/dL (ref 6–20)
CO2: 22 mmol/L (ref 22–32)
CREATININE: 0.64 mg/dL (ref 0.61–1.24)
Calcium: 8.3 mg/dL — ABNORMAL LOW (ref 8.9–10.3)
Chloride: 100 mmol/L — ABNORMAL LOW (ref 101–111)
GFR calc Af Amer: >60 mL/min (ref >60)
GFR calc non Af Amer: >60 mL/min (ref >60)
GLUCOSE: 90 mg/dL (ref 65–99)
Potassium: 2.3 mmol/L — CL (ref 3.5–5.1)
SODIUM: 132 mmol/L — AB (ref 135–145)

## 2016-08-07 LAB — CBC
HCT: 30.2 % — ABNORMAL LOW (ref 39.0–52.0)
HEMOGLOBIN: 9.8 g/dL — AB (ref 13.0–17.0)
MCH: 27.3 pg (ref 26.0–34.0)
MCHC: 32.5 g/dL (ref 30.0–36.0)
MCV: 84.1 fL (ref 78.0–100.0)
PLATELETS: 106 10*3/uL — AB (ref 150–400)
RBC: 3.59 MIL/uL — ABNORMAL LOW (ref 4.22–5.81)
RDW: 15.3 % (ref 11.5–15.5)
WBC: 12.1 10*3/uL — ABNORMAL HIGH (ref 4.0–10.5)

## 2016-08-07 LAB — TRIGLYCERIDES: Triglycerides: 96 mg/dL (ref <150)

## 2016-08-07 MED ORDER — MORPHINE BOLUS VIA INFUSION
2.0000 mg | INTRAVENOUS | Status: DC | PRN
  Filled 2016-08-07: qty 2

## 2016-08-07 MED ORDER — GLYCOPYRROLATE 0.2 MG/ML IJ SOLN: 0.2000 mg | INTRAMUSCULAR | Status: DC | PRN

## 2016-08-07 MED ORDER — POTASSIUM CHLORIDE 10 MEQ/100ML IV SOLN
10.0000 meq | INTRAVENOUS | Status: DC
  Administered 2016-08-07 (×2): 10 meq via INTRAVENOUS
  Filled 2016-08-07 (×2): qty 100

## 2016-08-07 MED ORDER — POTASSIUM CHLORIDE 20 MEQ PO PACK
20.0000 meq | PACK | Freq: Two times a day (BID) | ORAL | Status: DC
  Administered 2016-08-07: 20 meq via ORAL
  Filled 2016-08-07: qty 1

## 2016-08-07 MED ORDER — MORPHINE BOLUS VIA INFUSION
2.0000 mg | Freq: Once | INTRAVENOUS | Status: DC
  Filled 2016-08-07: qty 2

## 2016-08-07 MED ORDER — LORAZEPAM 2 MG/ML IJ SOLN
2.0000 mg | Freq: Four times a day (QID) | INTRAMUSCULAR | Status: DC
  Administered 2016-08-07: 2 mg via INTRAVENOUS
  Filled 2016-08-07: qty 1

## 2016-08-07 MED ORDER — SODIUM CHLORIDE 0.9 % IV SOLN
1.0000 mg/h | INTRAVENOUS | Status: DC
  Administered 2016-08-07: 1 mg/h via INTRAVENOUS
  Filled 2016-08-07: qty 10

## 2016-08-08 LAB — URINE DRUGS OF ABUSE SCREEN W ALC, ROUTINE (REF LAB)
AMPHETAMINES, URINE: NEGATIVE ng/mL
BARBITURATE, UR: NEGATIVE ng/mL
BENZODIAZEPINE QUANT UR: NEGATIVE ng/mL
CANNABINOID QUANT UR: NEGATIVE ng/mL
COCAINE (METAB.): NEGATIVE ng/mL
ETHANOL U, QUAN: NEGATIVE %
METHADONE SCREEN, URINE: NEGATIVE ng/mL
Opiate Quant, Ur: NEGATIVE ng/mL
Phencyclidine, Ur: NEGATIVE ng/mL
Propoxyphene, Urine: NEGATIVE ng/mL

## 2016-09-06 NOTE — Progress Notes (Signed)
This was a return visit to relieve Chaplain B. Hamilton and continue support to patient and family at bedside.  Patient being extubated and actively dying. Emotional, spiritual, grief support, empathetic listening and ministry of presence were provided by Chaplains. Remained with family until they were at a place of comfort and peace.  Chaplain available as needed.  Will pass on to on call night Chaplain for continued support as needed.  Venida JarvisWatlington, Camira Geidel, [place], [facility], Pager [phone]

## 2016-09-06 NOTE — Progress Notes (Signed)
Responded to [place] consult to visit with patient at end of life.  Patient no responding. Met with family and nurse to offer services.  Patient pastor is actively involved. Family appreciated previous chaplain visit. Family requested ongoing prayers for patient and family. Provided empathetic listening, presence, emotional and spiritual support. Chaplain available as needed.  Jaclynn Major, [place], [facility], Pager [phone]
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
## 2016-09-06 NOTE — Progress Notes (Signed)
Follow up - Trauma and Critical Care  Patient Details:    Perry Hull is an 56 y.o. male.  Lines/tubes : Airway 7 mm (Active)  Secured at (cm) 24 cm 08/22/2016  7:43 AM  Measured From Lips 08/22/2016  7:43 AM  Secured Location Left 08/19/2016  7:43 AM  Secured By [employer] Tube Holder 08/30/2016  7:43 AM  Tube Holder Repositioned Yes 08/08/2016  7:43 AM  Cuff Pressure (cm H2O) 25 cm H2O 08/06/2016 11:49 PM  Site Condition Dry 08/12/2016  7:43 AM     NG/OG Tube Orogastric 16 Fr. Center mouth Aucultation (Active)  Site Assessment Clean;Dry;Intact 08/06/2016  8:00 PM  Ongoing Placement Verification No change in respiratory status;No acute changes, not attributed to clinical condition;Xray 08/06/2016  8:00 PM  Status Suction-low intermittent 08/06/2016  8:00 PM  Amount of suction 110 mmHg 08/06/2016  7:32 AM  Drainage Appearance Brown 08/06/2016  8:00 AM  Intake (mL) 30 mL 08/15/2016 12:00 AM  Output (mL) 0 mL 08/17/2016  5:00 AM     Urethral Catheter Prom, EMT Non-latex 16 Fr. (Active)  Indication for Insertion or Continuance of Catheter Unstable critical patients (first 24-48 hours);End of life care 08/06/2016  8:00 PM  Site Assessment Clean;Intact 08/06/2016  8:00 PM  Catheter Maintenance Bag below level of bladder;Catheter secured;Drainage bag/tubing not touching floor;Insertion date on drainage bag;No dependent loops;Seal intact 08/06/2016  8:00 PM  Collection Container Standard drainage bag 08/06/2016  8:00 PM  Securement Method Securing device (Describe) 08/06/2016  8:00 PM  Urinary Catheter Interventions Unclamped 08/06/2016  8:00 PM  Output (mL) 175 mL 09/01/2016  6:00 AM    Microbiology/Sepsis markers: Results for orders placed or performed during the hospital encounter of 2016/07/19  MRSA PCR Screening     Status: None   Collection Time: 08/04/16 12:57 AM  Result Value Ref Range Status   MRSA by PCR NEGATIVE NEGATIVE Final    Comment:        The GeneXpert MRSA Assay (FDA approved for NASAL  specimens only), is one component of a comprehensive MRSA colonization surveillance program. It is not intended to diagnose MRSA infection nor to guide or monitor treatment for MRSA infections.     Anti-infectives:  Anti-infectives    None      Best Practice/Protocols:  VTE Prophylaxis: Lovenox (prophylaxtic dose) and Mechanical GI Prophylaxis: Proton Pump Inhibitor   Consults:  neurology palliative   Events:  Chief Complaint/Subjective:    Overnight Issues: Continues to breath over the vent  Objective:  Vital signs for last 24 hours: Temp:  [99.1 F (37.3 C)-100.9 F (38.3 C)] 100.8 F (38.2 C) (08/02 0800) Pulse Rate:  [70-97] 93 (08/02 0800) Resp:  [17-37] 23 (08/02 0800) BP: (125-202)/(62-98) 135/89 (08/02 0800) SpO2:  [99 %-100 %] 100 % (08/02 0800) FiO2 (%):  [40 %] 40 % (08/02 0743)  Hemodynamic parameters for last 24 hours:    Intake/Output from previous day: 08/01 0701 - 08/02 0700 In: 2650 [I.V.:2400; NG/GT:30; IV Piggyback:220] Out: 2975 [Urine:2975]  Intake/Output this shift: No intake/output data recorded.  Vent settings for last 24 hours: Vent Mode: PRVC FiO2 (%):  [40 %] 40 % Set Rate:  [12 bmp] 12 bmp Vt Set:  [480 mL] 480 mL PEEP:  [5 cmH20] 5 cmH20 Pressure Support:  [10 cmH20] 10 cmH20 Plateau Pressure:  [10 cmH20-16 cmH20] 10 cmH20  Physical Exam:  Gen: intubated, nonresponsive HEENT: ETT and OG tube in good position Resp: assisted, clear b/l Cardiovascular: RRR Abdomen: soft,  NT, ND Ext: no edema Neuro: GCS 3T, no gag, no corneal reflex b/l  Results for orders placed or performed during the hospital encounter of 07/14/2016 (from the past 24 hour(s))  Urine rapid drug screen (hosp performed)     Status: None   Collection Time: 08/06/16 12:18 PM  Result Value Ref Range   Opiates NONE DETECTED NONE DETECTED   Cocaine NONE DETECTED NONE DETECTED   Benzodiazepines NONE DETECTED NONE DETECTED   Amphetamines NONE DETECTED  NONE DETECTED   Tetrahydrocannabinol NONE DETECTED NONE DETECTED   Barbiturates NONE DETECTED NONE DETECTED  Triglycerides     Status: None   Collection Time: 27-Nov-2016  3:05 AM  Result Value Ref Range   Triglycerides 96 <150 mg/dL  CBC     Status: Abnormal   Collection Time: 27-Nov-2016  3:05 AM  Result Value Ref Range   WBC 12.1 (H) 4.0 - 10.5 K/uL   RBC 3.59 (L) 4.22 - 5.81 MIL/uL   Hemoglobin 9.8 (L) 13.0 - 17.0 g/dL   HCT 16.130.2 (L) 09.639.0 - 04.552.0 %   MCV 84.1 78.0 - 100.0 fL   MCH 27.3 26.0 - 34.0 pg   MCHC 32.5 30.0 - 36.0 g/dL   RDW 40.915.3 81.111.5 - 91.415.5 %   Platelets 106 (L) 150 - 400 K/uL  Basic metabolic panel     Status: Abnormal   Collection Time: 27-Nov-2016  3:05 AM  Result Value Ref Range   Sodium 132 (L) 135 - 145 mmol/L   Potassium 2.3 (LL) 3.5 - 5.1 mmol/L   Chloride 100 (L) 101 - 111 mmol/L   CO2 22 22 - 32 mmol/L   Glucose, Bld 90 65 - 99 mg/dL   BUN 7 6 - 20 mg/dL   Creatinine, Ser 7.820.64 0.61 - 1.24 mg/dL   Calcium 8.3 (L) 8.9 - 10.3 mg/dL   GFR calc non Af Amer >60 >60 mL/min   GFR calc Af Amer >60 >60 mL/min   Anion gap 10 5 - 15     Assessment/Plan:   NEURO  Severe anoxic brain injury, overbreathing vent but no other brainstem reflex   Plan: appreciate neuro recs, U tox negative yesterday, EEG and MRI consistent with anoxia, no improvement in >48h  PULM  Decreased mentation leading to resp failur   Plan: continue vent  CARDIO  No issues   Plan: continue to monitor  RENAL  hypokalemia   Plan: will replace  GI  No issues   Plan: OG to suction  ID  No issues   Plan: monitor and maintain excellent critical nurse care  HEME  No issues   Plan: SCDs  ENDO No issues   Plan: monitor  Global Issues  Poor prognosis with no improvement in >48h. Palliative consult today, family meeting today    LOS: 4 days   Additional comments:I reviewed the patient's new clinical lab test results. K 2.3  Critical Care Total Time*: 15 Minutes  De BlanchLuke Hull  Perry Hull 08/27/2016  *Care during the described time interval was provided by me and/or other providers on the critical care team.  I have reviewed this patient's available data, including medical history, events of note, physical examination and test results as part of my evaluation.

## 2016-09-06 NOTE — Progress Notes (Signed)
235ml of morphine 1mg /ml wasted in sink with Isaias SakaiAmy V Smith RN

## 2016-09-06 NOTE — Progress Notes (Signed)
Pt with asystole on monitor. No breath and cardiac sounds auscultated by myself and Heloise PurpuraSusan Mayes RN. Time of death 16:41. Family at bedside. Emotional support given.
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
## 2016-09-06 NOTE — Progress Notes (Signed)
Subjective: Patient remains unresponsive. Has been off sedation.     Exam: Vitals:   06-Nov-2016 0900 06-Nov-2016 1000  BP: (!) 155/68 (!) 160/77  Pulse: 93 94  Resp: 18 15  Temp: (!) 100.9 F (38.3 C) (!) 100.6 F (38.1 C)   General: Orally intubated - Eyes: Pupils constricted approx 2-3 mm fixed Cardiovascular: Normal rate, regular rhythmand normal heart sounds.  Respiratory: overbreath ventilator GI: Soft. He exhibits no distensionand no mass.  Skin: Skin is warmand dry.   Neuro: Mental Status: Unresponsive Cranial Nerves: JY:NWGNFAI:Pupils constricted, not reactive III,IV, VI: Oculocephalic absent V: Corneal absent VII: Facial movement is symmetric.  X: Cough, gag absent XI:   Motor: Tone is increased on left side, does not withdraw In any extremity Deep Tendon Reflexes: 1+ and symmetric in the biceps and patellae Plantars: Mute bilaterally  ASSESSMENT AND PLAN  Severe Anoxic Injury   History of cardiac arrest and MRI Brain consistent with anoxic injury. EEG findings of burst suppression consistent with poor prognosis.  Exam at day 4 from event with UDS negative for sedating medications -Patient has no pupillary, corneal, gag reflexes. No spontaneous movements. Discussed with family that patient has no meaningful recovery ( based on their understanding of his definition of quality of life).  Palliative care consulted and had family discussion today. Family wishes withdraw life support and transition to comfort care.

## 2016-09-06 NOTE — Procedures (Signed)
Extubation Procedure Note  Patient Details:   Name: Neldon LabellaJesse Giovannetti DOB: 07/17/1960 MRN: [id number]   Airway Documentation:     Evaluation  O2 sats: stable throughout Complications: No apparent complications Patient did tolerate procedure well. Bilateral Breath Sounds: Diminished, Rhonchi   No   Pt terminally extubated to room air.  RN at bedside along with family.  Pt suction via his ETT and mouth.  Pt is a DNR.  RT will continue to monitor.  Ronny FlurryMorgan B Elasha Tess 08/24/2016, 4:13 PM
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
## 2016-09-06 DEATH — deceased
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
